# Patient Record
Sex: Male | Born: 1995 | Race: White | Hispanic: No | Marital: Single | Smoking: Current every day smoker
Health system: Southern US, Community
[De-identification: ages and names within clinical notes are randomized; demographics above are authoritative.]

## PROBLEM LIST (undated history)

## (undated) DIAGNOSIS — I10 Essential (primary) hypertension: Secondary | ICD-10-CM

---
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
## 2014-10-27 ENCOUNTER — Emergency Department (HOSPITAL_COMMUNITY): Payer: BLUE CROSS/BLUE SHIELD

## 2014-10-27 ENCOUNTER — Encounter (HOSPITAL_COMMUNITY): Payer: Self-pay | Admitting: *Deleted

## 2014-10-27 DIAGNOSIS — S062X0A Diffuse traumatic brain injury without loss of consciousness, initial encounter: Secondary | ICD-10-CM | POA: Diagnosis present

## 2014-10-27 DIAGNOSIS — I10 Essential (primary) hypertension: Secondary | ICD-10-CM | POA: Diagnosis not present

## 2014-10-27 DIAGNOSIS — S06330A Contusion and laceration of cerebrum, unspecified, without loss of consciousness, initial encounter: Principal | ICD-10-CM | POA: Insufficient documentation

## 2014-10-27 LAB — CBC WITH DIFFERENTIAL/PLATELET
Basophils Absolute: 0 10*3/uL (ref 0.0–0.1)
Basophils Relative: 0 % (ref 0–1)
EOS ABS: 0.1 10*3/uL (ref 0.0–0.7)
EOS PCT: 1 % (ref 0–5)
HCT: 41.5 % (ref 39.0–52.0)
HEMOGLOBIN: 14.5 g/dL (ref 13.0–17.0)
Lymphocytes Relative: 8 % — ABNORMAL LOW (ref 12–46)
Lymphs Abs: 1.2 10*3/uL (ref 0.7–4.0)
MCH: 28.2 pg (ref 26.0–34.0)
MCHC: 34.9 g/dL (ref 30.0–36.0)
MCV: 80.6 fL (ref 78.0–100.0)
MONO ABS: 1.1 10*3/uL — AB (ref 0.1–1.0)
MONOS PCT: 7 % (ref 3–12)
Neutro Abs: 13.4 10*3/uL — ABNORMAL HIGH (ref 1.7–7.7)
Neutrophils Relative %: 84 % — ABNORMAL HIGH (ref 43–77)
Platelets: 192 10*3/uL (ref 150–400)
RBC: 5.15 MIL/uL (ref 4.22–5.81)
RDW: 12.7 % (ref 11.5–15.5)
WBC: 15.9 10*3/uL — ABNORMAL HIGH (ref 4.0–10.5)

## 2014-10-27 LAB — COMPREHENSIVE METABOLIC PANEL
ALT: 16 U/L — AB (ref 17–63)
AST: 25 U/L (ref 15–41)
Albumin: 4.4 g/dL (ref 3.5–5.0)
Alkaline Phosphatase: 65 U/L (ref 38–126)
Anion gap: 9 (ref 5–15)
BILIRUBIN TOTAL: 0.8 mg/dL (ref 0.3–1.2)
BUN: 17 mg/dL (ref 6–20)
CHLORIDE: 106 mmol/L (ref 101–111)
CO2: 23 mmol/L (ref 22–32)
CREATININE: 0.86 mg/dL (ref 0.61–1.24)
Calcium: 9.5 mg/dL (ref 8.9–10.3)
GFR calc Af Amer: >60 mL/min (ref >60)
Glucose, Bld: 115 mg/dL — ABNORMAL HIGH (ref 70–99)
Potassium: 3.9 mmol/L (ref 3.5–5.1)
Sodium: 138 mmol/L (ref 135–145)
Total Protein: 6.9 g/dL (ref 6.5–8.1)
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
## 2014-10-28 ENCOUNTER — Encounter (HOSPITAL_COMMUNITY): Payer: Self-pay

## 2014-10-28 ENCOUNTER — Observation Stay (HOSPITAL_COMMUNITY): Payer: BLUE CROSS/BLUE SHIELD

## 2014-10-28 ENCOUNTER — Observation Stay (HOSPITAL_COMMUNITY)
Admission: EM | Admit: 2014-10-28 | Discharge: 2014-10-28 | Disposition: A | Discharge status: Home / Self Care | Payer: BLUE CROSS/BLUE SHIELD | Attending: Neurological Surgery | Admitting: Neurological Surgery

## 2014-10-28 ENCOUNTER — Emergency Department (HOSPITAL_COMMUNITY): Payer: BLUE CROSS/BLUE SHIELD

## 2014-10-28 DIAGNOSIS — S0990XA Unspecified injury of head, initial encounter: Secondary | ICD-10-CM | POA: Diagnosis present

## 2014-10-28 DIAGNOSIS — M79602 Pain in left arm: Secondary | ICD-10-CM

## 2014-10-28 HISTORY — DX: Essential (primary) hypertension: I10

## 2014-10-28 MED ORDER — ALUM & MAG HYDROXIDE-SIMETH 200-200-20 MG/5ML PO SUSP: 30.0000 mL | Freq: Four times a day (QID) | ORAL | Status: DC | PRN

## 2014-10-28 MED ORDER — HYDROCODONE-ACETAMINOPHEN 5-325 MG PO TABS
1.0000 | ORAL_TABLET | ORAL | Status: DC | PRN
  Administered 2014-10-28 (×4): 1 via ORAL
  Filled 2014-10-28 (×4): qty 1

## 2014-10-28 MED ORDER — PHENOL 1.4 % MT LIQD
1.0000 | OROMUCOSAL | Status: DC | PRN
  Administered 2014-10-28: 1 via OROMUCOSAL
  Filled 2014-10-28: qty 177

## 2014-10-28 MED ORDER — SODIUM CHLORIDE 0.9 % IV BOLUS (SEPSIS)
1000.0000 mL | Freq: Once | INTRAVENOUS | Status: AC
  Administered 2014-10-28: 1000 mL via INTRAVENOUS

## 2014-10-28 MED ORDER — BISACODYL 10 MG RE SUPP: 10.0000 mg | Freq: Every day | RECTAL | Status: DC | PRN

## 2014-10-28 MED ORDER — ACETAMINOPHEN 160 MG/5ML PO SOLN
650.0000 mg | Freq: Once | ORAL | Status: AC
  Administered 2014-10-28: 650 mg via ORAL
  Filled 2014-10-28: qty 20.3

## 2014-10-28 MED ORDER — DOCUSATE SODIUM 100 MG PO CAPS
100.0000 mg | ORAL_CAPSULE | Freq: Two times a day (BID) | ORAL | Status: DC
  Administered 2014-10-28: 100 mg via ORAL
  Filled 2014-10-28: qty 1

## 2014-10-28 MED ORDER — POLYETHYLENE GLYCOL 3350 17 G PO PACK: 17.0000 g | PACK | Freq: Every day | ORAL | Status: DC | PRN

## 2014-10-28 MED ORDER — IOHEXOL 300 MG/ML  SOLN
75.0000 mL | Freq: Once | INTRAMUSCULAR | Status: AC | PRN
  Administered 2014-10-28: 75 mL via INTRAVENOUS

## 2014-10-28 MED ORDER — HYDROCODONE-ACETAMINOPHEN 5-325 MG PO TABS: 1.0000 | ORAL_TABLET | ORAL | Status: AC | PRN

## 2014-10-28 MED ORDER — SENNA 8.6 MG PO TABS
1.0000 | ORAL_TABLET | Freq: Two times a day (BID) | ORAL | Status: DC
  Administered 2014-10-28: 8.6 mg via ORAL
  Filled 2014-10-28: qty 1

## 2014-10-28 NOTE — Progress Notes (Signed)
Patient discharged around 2100, prescription was given to mother and family who accompanied downstairs to main entrance, he was alert and oriented and able to ambulate on his own.

## 2014-10-28 NOTE — ED Notes (Signed)
Patient returned from CT
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
## 2014-10-28 NOTE — Progress Notes (Signed)
Patient ID: Bo MerinoBret W Rizo, male   DOB: Jan 26, 1996, 19 y.o.   MRN: [id number] Vital signs are stable Motor function appears stable He is able to swallow though he notes significant soreness in his throat Will mobilized today with physical therapy Maintain on full liquid diet Repeat CT of head this afternoon.

## 2014-10-28 NOTE — ED Notes (Signed)
Patient taken to CT.
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
## 2014-10-28 NOTE — Progress Notes (Signed)
Patient with small amount of emesis while in bathroom, nurse unable to assess. MD paged. Patient has hematoma on left arm, warm to touch, which has gotten larger. MD paged.
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
## 2014-10-28 NOTE — Progress Notes (Signed)
Patient arrived around [id number] he is alert and oriented with little complaint of pain. He was able to walk over to bed and to the bathroom. His Mother is in the room with him SCD's are ordered and he is resting comfortably. Will continue to monitor.

## 2014-10-28 NOTE — ED Provider Notes (Signed)
LACERATION REPAIR Performed by: Antony MaduraHUMES, Kyndal Gloster Authorized by: Antony MaduraHUMES, Lynore Coscia Consent: Verbal consent obtained. Risks and benefits: risks, benefits and alternatives were discussed Consent given by: patient Patient identity confirmed: provided demographic data Prepped and Draped in normal sterile fashion Wound explored  Laceration Location: parietal, midline  Laceration Length: 1cm  No Foreign Bodies seen or palpated  Anesthesia: none  Local anesthetic: none  Anesthetic total: n/a  Irrigation method: syringe Amount of cleaning: standard  Skin closure: staples  Number of sutures: 2  Technique: simple  Patient tolerance: Patient tolerated the procedure well with no immediate complications.  LACERATION REPAIR Performed by: Antony MaduraHUMES, Camira Geidel Authorized by: Antony MaduraHUMES, Audrie Kuri Consent: Verbal consent obtained. Risks and benefits: risks, benefits and alternatives were discussed Consent given by: patient Patient identity confirmed: provided demographic data Prepped and Draped in normal sterile fashion Wound explored  Laceration Location: occipital, left  Laceration Length: 0.5cm  No Foreign Bodies seen or palpated  Anesthesia: none  Local anesthetic: none  Anesthetic total: n/a  Irrigation method: syringe Amount of cleaning: standard  Skin closure: staples  Number of sutures: 1  Technique: simple  Patient tolerance: Patient tolerated the procedure well with no immediate complications.   Antony MaduraKelly Mitcheal Sweetin, PA-C 10/28/14 0250  Marisa Severinlga Otter, MD 10/28/14 0600
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
